# Patient Record
Sex: Male | Born: 1985 | Race: Black or African American | Hispanic: No | Marital: Single | Smoking: Current every day smoker
Health system: Southern US, Community
[De-identification: ages and names within clinical notes are randomized; demographics above are authoritative.]

## PROBLEM LIST (undated history)

## (undated) DIAGNOSIS — S83519A Sprain of anterior cruciate ligament of unspecified knee, initial encounter: Secondary | ICD-10-CM

## (undated) HISTORY — PX: FRACTURE SURGERY: SHX138

## (undated) HISTORY — PX: OTHER SURGICAL HISTORY: SHX169

---
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
## 2013-03-23 ENCOUNTER — Encounter (HOSPITAL_COMMUNITY): Payer: Self-pay | Admitting: Emergency Medicine

## 2013-03-23 ENCOUNTER — Emergency Department (HOSPITAL_COMMUNITY)
Admission: EM | Admit: 2013-03-23 | Discharge: 2013-03-23 | Disposition: A | Discharge status: Home / Self Care | Payer: No Typology Code available for payment source | Attending: Emergency Medicine | Admitting: Emergency Medicine

## 2013-03-23 DIAGNOSIS — F17200 Nicotine dependence, unspecified, uncomplicated: Secondary | ICD-10-CM | POA: Insufficient documentation

## 2013-03-23 DIAGNOSIS — IMO0002 Reserved for concepts with insufficient information to code with codable children: Secondary | ICD-10-CM | POA: Insufficient documentation

## 2013-03-23 DIAGNOSIS — Y9389 Activity, other specified: Secondary | ICD-10-CM | POA: Insufficient documentation

## 2013-03-23 DIAGNOSIS — Y92410 Unspecified street and highway as the place of occurrence of the external cause: Secondary | ICD-10-CM | POA: Insufficient documentation

## 2013-03-23 DIAGNOSIS — S0990XA Unspecified injury of head, initial encounter: Secondary | ICD-10-CM | POA: Insufficient documentation

## 2013-03-23 DIAGNOSIS — Z79899 Other long term (current) drug therapy: Secondary | ICD-10-CM | POA: Insufficient documentation

## 2013-03-23 DIAGNOSIS — S43402A Unspecified sprain of left shoulder joint, initial encounter: Secondary | ICD-10-CM

## 2013-03-23 MED ORDER — TRAMADOL HCL 50 MG PO TABS: 50.0000 mg | ORAL_TABLET | Freq: Four times a day (QID) | ORAL | Status: DC | PRN

## 2013-03-23 MED ORDER — CYCLOBENZAPRINE HCL 10 MG PO TABS: 10.0000 mg | ORAL_TABLET | Freq: Two times a day (BID) | ORAL | Status: DC | PRN

## 2013-03-23 MED ORDER — NAPROXEN 500 MG PO TABS: 500.0000 mg | ORAL_TABLET | Freq: Two times a day (BID) | ORAL | Status: DC
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
## 2013-03-24 NOTE — ED Provider Notes (Signed)
Medical screening examination/treatment/procedure(s) were performed by non-physician practitioner and as supervising physician I was immediately available for consultation/collaboration.  EKG Interpretation   None         Shanna Cisco, MD 03/24/13 1205

## 2014-06-20 ENCOUNTER — Emergency Department (HOSPITAL_COMMUNITY)
Admission: EM | Admit: 2014-06-20 | Discharge: 2014-06-20 | Disposition: A | Discharge status: Home / Self Care | Payer: Self-pay | Attending: Emergency Medicine | Admitting: Emergency Medicine

## 2014-06-20 ENCOUNTER — Emergency Department (HOSPITAL_COMMUNITY): Payer: Self-pay

## 2014-06-20 ENCOUNTER — Encounter (HOSPITAL_COMMUNITY): Payer: Self-pay | Admitting: Emergency Medicine

## 2014-06-20 DIAGNOSIS — Z791 Long term (current) use of non-steroidal anti-inflammatories (NSAID): Secondary | ICD-10-CM | POA: Insufficient documentation

## 2014-06-20 DIAGNOSIS — M25561 Pain in right knee: Secondary | ICD-10-CM | POA: Insufficient documentation

## 2014-06-20 DIAGNOSIS — Z720 Tobacco use: Secondary | ICD-10-CM | POA: Insufficient documentation

## 2014-06-20 DIAGNOSIS — Z87828 Personal history of other (healed) physical injury and trauma: Secondary | ICD-10-CM | POA: Insufficient documentation

## 2014-06-20 HISTORY — DX: Sprain of anterior cruciate ligament of unspecified knee, initial encounter: S83.519A

## 2014-06-20 MED ORDER — HYDROCODONE-ACETAMINOPHEN 5-325 MG PO TABS: 2.0000 | ORAL_TABLET | ORAL | Status: DC | PRN

## 2014-06-20 MED ORDER — IBUPROFEN 800 MG PO TABS: 800.0000 mg | ORAL_TABLET | Freq: Three times a day (TID) | ORAL | Status: DC

## 2014-06-20 MED ORDER — OXYCODONE-ACETAMINOPHEN 5-325 MG PO TABS: 2.0000 | ORAL_TABLET | ORAL | Status: DC | PRN

## 2014-06-20 NOTE — ED Notes (Signed)
Pt c/o right knee pain x's 2 days, no known injury

## 2014-06-20 NOTE — Discharge Instructions (Signed)
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
## 2015-03-11 ENCOUNTER — Emergency Department (HOSPITAL_COMMUNITY)
Admission: EM | Admit: 2015-03-11 | Discharge: 2015-03-11 | Disposition: A | Discharge status: Home / Self Care | Payer: Self-pay | Attending: Emergency Medicine | Admitting: Emergency Medicine

## 2015-03-11 ENCOUNTER — Encounter (HOSPITAL_COMMUNITY): Payer: Self-pay | Admitting: Emergency Medicine

## 2015-03-11 DIAGNOSIS — M25562 Pain in left knee: Secondary | ICD-10-CM | POA: Insufficient documentation

## 2015-03-11 DIAGNOSIS — F17210 Nicotine dependence, cigarettes, uncomplicated: Secondary | ICD-10-CM | POA: Insufficient documentation

## 2015-03-11 DIAGNOSIS — Z87828 Personal history of other (healed) physical injury and trauma: Secondary | ICD-10-CM | POA: Insufficient documentation

## 2015-03-11 DIAGNOSIS — M25561 Pain in right knee: Secondary | ICD-10-CM | POA: Insufficient documentation

## 2015-03-11 DIAGNOSIS — Z79899 Other long term (current) drug therapy: Secondary | ICD-10-CM | POA: Insufficient documentation
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
## 2015-03-11 NOTE — Discharge Instructions (Signed)
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
## 2015-03-11 NOTE — ED Notes (Signed)
See providers assessment.  

## 2015-08-23 ENCOUNTER — Encounter (HOSPITAL_COMMUNITY): Payer: Self-pay | Admitting: Emergency Medicine

## 2015-08-23 ENCOUNTER — Emergency Department (HOSPITAL_COMMUNITY)
Admission: EM | Admit: 2015-08-23 | Discharge: 2015-08-23 | Disposition: A | Discharge status: Home / Self Care | Payer: Managed Care, Other (non HMO) | Attending: Emergency Medicine | Admitting: Emergency Medicine

## 2015-08-23 DIAGNOSIS — Z791 Long term (current) use of non-steroidal anti-inflammatories (NSAID): Secondary | ICD-10-CM | POA: Diagnosis not present

## 2015-08-23 DIAGNOSIS — F17210 Nicotine dependence, cigarettes, uncomplicated: Secondary | ICD-10-CM | POA: Diagnosis not present

## 2015-08-23 DIAGNOSIS — R51 Headache: Secondary | ICD-10-CM | POA: Insufficient documentation

## 2015-08-23 DIAGNOSIS — K0889 Other specified disorders of teeth and supporting structures: Secondary | ICD-10-CM | POA: Insufficient documentation

## 2015-08-23 DIAGNOSIS — Z87828 Personal history of other (healed) physical injury and trauma: Secondary | ICD-10-CM | POA: Diagnosis not present

## 2015-08-23 DIAGNOSIS — R590 Localized enlarged lymph nodes: Secondary | ICD-10-CM | POA: Insufficient documentation

## 2015-08-23 DIAGNOSIS — K029 Dental caries, unspecified: Secondary | ICD-10-CM | POA: Diagnosis not present

## 2015-08-23 MED ORDER — PENICILLIN V POTASSIUM 500 MG PO TABS: 500.0000 mg | ORAL_TABLET | Freq: Three times a day (TID) | ORAL | Status: DC

## 2015-08-23 MED ORDER — OXYCODONE-ACETAMINOPHEN 5-325 MG PO TABS
1.0000 | ORAL_TABLET | Freq: Once | ORAL | Status: AC
  Administered 2015-08-23: 1 via ORAL
  Filled 2015-08-23: qty 1

## 2015-08-23 MED ORDER — NAPROXEN 500 MG PO TABS: 500.0000 mg | ORAL_TABLET | Freq: Two times a day (BID) | ORAL | Status: DC

## 2015-08-23 NOTE — ED Notes (Signed)
Patient able to ambulate independently.  Patient called wife for pickup
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
## 2015-08-23 NOTE — ED Notes (Signed)
Pt presents to ED for assessment of right sided lower dental pain that began approx 2 weeks ago.  Pt has attempted mouth rinse, naprosyn, orajel, ibuprofen, without any relief.   Pt c/o some difficulty with swallowing, denies difficulty breathing.

## 2015-08-23 NOTE — Discharge Instructions (Signed)

## 2016-06-23 ENCOUNTER — Emergency Department (HOSPITAL_COMMUNITY): Payer: Managed Care, Other (non HMO)

## 2016-06-23 ENCOUNTER — Encounter (HOSPITAL_COMMUNITY): Payer: Self-pay | Admitting: *Deleted

## 2016-06-23 ENCOUNTER — Emergency Department (HOSPITAL_COMMUNITY)
Admission: EM | Admit: 2016-06-23 | Discharge: 2016-06-23 | Disposition: A | Discharge status: Home / Self Care | Payer: Managed Care, Other (non HMO) | Attending: Emergency Medicine | Admitting: Emergency Medicine

## 2016-06-23 DIAGNOSIS — Y9389 Activity, other specified: Secondary | ICD-10-CM | POA: Insufficient documentation

## 2016-06-23 DIAGNOSIS — S239XXA Sprain of unspecified parts of thorax, initial encounter: Secondary | ICD-10-CM

## 2016-06-23 DIAGNOSIS — Y999 Unspecified external cause status: Secondary | ICD-10-CM | POA: Insufficient documentation

## 2016-06-23 DIAGNOSIS — S3992XA Unspecified injury of lower back, initial encounter: Secondary | ICD-10-CM | POA: Diagnosis present

## 2016-06-23 DIAGNOSIS — Y92410 Unspecified street and highway as the place of occurrence of the external cause: Secondary | ICD-10-CM | POA: Diagnosis not present

## 2016-06-23 DIAGNOSIS — S233XXA Sprain of ligaments of thoracic spine, initial encounter: Secondary | ICD-10-CM | POA: Diagnosis not present

## 2016-06-23 DIAGNOSIS — F17210 Nicotine dependence, cigarettes, uncomplicated: Secondary | ICD-10-CM | POA: Diagnosis not present

## 2016-06-23 MED ORDER — METHOCARBAMOL 500 MG PO TABS: 500.0000 mg | ORAL_TABLET | Freq: Two times a day (BID) | ORAL | 0 refills | Status: AC

## 2016-06-23 MED ORDER — DICLOFENAC SODIUM 50 MG PO TBEC: 50.0000 mg | DELAYED_RELEASE_TABLET | Freq: Two times a day (BID) | ORAL | 0 refills | Status: AC

## 2016-06-23 MED ORDER — METHOCARBAMOL 500 MG PO TABS: 500.0000 mg | ORAL_TABLET | Freq: Two times a day (BID) | ORAL | 0 refills | Status: DC

## 2016-06-23 MED ORDER — DICLOFENAC SODIUM 50 MG PO TBEC: 50.0000 mg | DELAYED_RELEASE_TABLET | Freq: Two times a day (BID) | ORAL | 0 refills | Status: DC

## 2016-06-23 MED ORDER — IBUPROFEN 400 MG PO TABS
600.0000 mg | ORAL_TABLET | Freq: Once | ORAL | Status: AC
  Administered 2016-06-23: 16:00:00 600 mg via ORAL
  Filled 2016-06-23: qty 1
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
## 2016-06-23 NOTE — ED Notes (Signed)
Pt stable, understands discharge instructions, and reasons for return.   
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
## 2019-01-04 ENCOUNTER — Other Ambulatory Visit: Payer: Self-pay

## 2019-01-04 DIAGNOSIS — Z20822 Contact with and (suspected) exposure to covid-19: Secondary | ICD-10-CM

## 2019-01-05 LAB — NOVEL CORONAVIRUS, NAA: SARS-CoV-2, NAA: NOT DETECTED

## 2019-04-12 ENCOUNTER — Ambulatory Visit: Payer: Managed Care, Other (non HMO) | Attending: Internal Medicine

## 2019-04-12 DIAGNOSIS — Z20822 Contact with and (suspected) exposure to covid-19: Secondary | ICD-10-CM

## 2019-04-14 LAB — NOVEL CORONAVIRUS, NAA: SARS-CoV-2, NAA: NOT DETECTED
